# Patient Record
Sex: Female | Born: 1975 | Race: White | Hispanic: No | Marital: Married | Smoking: Never smoker
Health system: Southern US, Community
[De-identification: ages and names within clinical notes are randomized; demographics above are authoritative.]

## PROBLEM LIST (undated history)

## (undated) DIAGNOSIS — Z789 Other specified health status: Secondary | ICD-10-CM

## (undated) HISTORY — PX: WISDOM TOOTH EXTRACTION: SHX21

---

## 2008-05-06 ENCOUNTER — Inpatient Hospital Stay (HOSPITAL_COMMUNITY): Admission: AD | Admit: 2008-05-06 | Discharge: 2008-05-06 | Payer: Self-pay | Admitting: Obstetrics and Gynecology

## 2008-10-19 ENCOUNTER — Inpatient Hospital Stay (HOSPITAL_COMMUNITY): Admission: AD | Admit: 2008-10-19 | Discharge: 2008-10-22 | Payer: Self-pay | Admitting: Obstetrics and Gynecology

## 2010-01-27 ENCOUNTER — Emergency Department (HOSPITAL_COMMUNITY): Admission: EM | Admit: 2010-01-27 | Discharge: 2010-01-27 | Payer: Self-pay | Admitting: Emergency Medicine

## 2010-10-06 NOTE — L&D Delivery Note (Signed)
Delivery Note  Pt began to feel pressure and some urge to push with anterior lip of cervix. After several position changes, cervix reduced and pt had stronger urge to push. FHR remained stable.  At 5:08 PM a viable female was delivered via Vaginal, Spontaneous Delivery (Presentation: Right Occiput Anterior). Born en caul, loose nuchal reduced before shoulders delivered.  APGAR: 8, 9; weight 7 lb 4.2 oz (3295 g).   Placenta status: Intact, Spontaneous.  Cord: 3 vessels with the following complications: None.   Anesthesia: Local  Episiotomy: None Lacerations: 1st degree Suture Repair: 4-0 Est. Blood Loss (mL): 300  Mom to postpartum.  Baby to nursery-stable. Skin-skin Plans to BF   Katrina King M 10/06/2011, 5:51 PM

## 2011-01-20 LAB — CBC
HCT: 37.7 % (ref 36.0–46.0)
HCT: 32.8 % — ABNORMAL LOW (ref 36.0–46.0)
Hemoglobin: 11.2 g/dL — ABNORMAL LOW (ref 12.0–15.0)
MCHC: 32.7 g/dL (ref 30.0–36.0)
MCHC: 34.2 g/dL (ref 30.0–36.0)
MCV: 89.8 fL (ref 78.0–100.0)
MCV: 89.2 fL (ref 78.0–100.0)
Platelets: 158 10*3/uL (ref 150–400)
Platelets: 140 10*3/uL — ABNORMAL LOW (ref 150–400)
RDW: 14.8 % (ref 11.5–15.5)
WBC: 13.4 10*3/uL — ABNORMAL HIGH (ref 4.0–10.5)

## 2011-01-20 LAB — COMPREHENSIVE METABOLIC PANEL
ALT: 14 U/L (ref 0–35)
Alkaline Phosphatase: 146 U/L — ABNORMAL HIGH (ref 39–117)
Creatinine, Ser: 0.5 mg/dL (ref 0.4–1.2)
GFR calc non Af Amer: >60 mL/min (ref >60)
Total Bilirubin: 0.2 mg/dL — ABNORMAL LOW (ref 0.3–1.2)

## 2011-01-20 LAB — URIC ACID: Uric Acid, Serum: 4.4 mg/dL (ref 2.4–7.0)

## 2011-02-14 LAB — RPR
RPR: NONREACTIVE
RPR: NONREACTIVE

## 2011-02-14 LAB — GC/CHLAMYDIA PROBE AMP, GENITAL: Chlamydia: NEGATIVE

## 2011-02-14 LAB — ABO/RH

## 2011-02-14 LAB — HIV ANTIBODY (ROUTINE TESTING W REFLEX)
HIV: NONREACTIVE
HIV: NONREACTIVE

## 2011-02-14 LAB — ANTIBODY SCREEN: Antibody Screen: NEGATIVE

## 2011-07-04 LAB — URINALYSIS, ROUTINE W REFLEX MICROSCOPIC
Nitrite: NEGATIVE
Protein, ur: NEGATIVE

## 2011-07-04 LAB — URINE MICROSCOPIC-ADD ON

## 2011-10-06 ENCOUNTER — Inpatient Hospital Stay (HOSPITAL_COMMUNITY)
Admission: AD | Admit: 2011-10-06 | Discharge: 2011-10-07 | DRG: 373 | Disposition: A | Discharge status: Home / Self Care | Payer: BC Managed Care – PPO | Source: Ambulatory Visit | Attending: Obstetrics and Gynecology | Admitting: Obstetrics and Gynecology

## 2011-10-06 ENCOUNTER — Encounter (HOSPITAL_COMMUNITY): Payer: Self-pay

## 2011-10-06 DIAGNOSIS — Z3490 Encounter for supervision of normal pregnancy, unspecified, unspecified trimester: Secondary | ICD-10-CM

## 2011-10-06 DIAGNOSIS — O2200 Varicose veins of lower extremity in pregnancy, unspecified trimester: Secondary | ICD-10-CM | POA: Diagnosis present

## 2011-10-06 DIAGNOSIS — I8390 Asymptomatic varicose veins of unspecified lower extremity: Secondary | ICD-10-CM

## 2011-10-06 HISTORY — DX: Other specified health status: Z78.9

## 2011-10-06 LAB — CBC
HCT: 38.9 % (ref 36.0–46.0)
Hemoglobin: 13.1 g/dL (ref 12.0–15.0)
MCHC: 33.7 g/dL (ref 30.0–36.0)
WBC: 9.5 10*3/uL (ref 4.0–10.5)

## 2011-10-06 LAB — SYPHILIS: RPR W/REFLEX TO RPR TITER AND TREPONEMAL ANTIBODIES, TRADITIONAL SCREENING AND DIAGNOSIS ALGORITHM: RPR Ser Ql: NONREACTIVE

## 2011-10-06 MED ORDER — BENZOCAINE-MENTHOL 20-0.5 % EX AERO
INHALATION_SPRAY | CUTANEOUS | Status: AC
  Administered 2011-10-06: 1 via TOPICAL
  Filled 2011-10-06: qty 56

## 2011-10-06 MED ORDER — IBUPROFEN 600 MG PO TABS: 600.0000 mg | ORAL_TABLET | Freq: Four times a day (QID) | ORAL | Status: DC | PRN

## 2011-10-06 MED ORDER — OXYCODONE-ACETAMINOPHEN 5-325 MG PO TABS: 2.0000 | ORAL_TABLET | ORAL | Status: DC | PRN

## 2011-10-06 MED ORDER — PRENATAL MULTIVITAMIN CH
1.0000 | ORAL_TABLET | Freq: Every day | ORAL | Status: DC
  Administered 2011-10-07: 1 via ORAL
  Filled 2011-10-06: qty 1

## 2011-10-06 MED ORDER — DIPHENHYDRAMINE HCL 50 MG/ML IJ SOLN: 12.5000 mg | INTRAMUSCULAR | Status: DC | PRN

## 2011-10-06 MED ORDER — LIDOCAINE HCL (PF) 1 % IJ SOLN
30.0000 mL | INTRAMUSCULAR | Status: DC | PRN
  Filled 2011-10-06: qty 30

## 2011-10-06 MED ORDER — PHENYLEPHRINE 40 MCG/ML (10ML) SYRINGE FOR IV PUSH (FOR BLOOD PRESSURE SUPPORT): 80.0000 ug | PREFILLED_SYRINGE | INTRAVENOUS | Status: DC | PRN

## 2011-10-06 MED ORDER — LACTATED RINGERS IV SOLN
INTRAVENOUS | Status: DC
  Administered 2011-10-06: 14:00:00 via INTRAVENOUS

## 2011-10-06 MED ORDER — ACETAMINOPHEN 325 MG PO TABS: 650.0000 mg | ORAL_TABLET | ORAL | Status: DC | PRN

## 2011-10-06 MED ORDER — DIBUCAINE 1 % RE OINT: 1.0000 "application " | TOPICAL_OINTMENT | RECTAL | Status: DC | PRN

## 2011-10-06 MED ORDER — IBUPROFEN 600 MG PO TABS
600.0000 mg | ORAL_TABLET | Freq: Four times a day (QID) | ORAL | Status: DC
  Administered 2011-10-06 – 2011-10-07 (×4): 600 mg via ORAL
  Filled 2011-10-06 (×4): qty 1

## 2011-10-06 MED ORDER — WITCH HAZEL-GLYCERIN EX PADS
1.0000 "application " | MEDICATED_PAD | CUTANEOUS | Status: DC | PRN
  Administered 2011-10-06: 1 via TOPICAL

## 2011-10-06 MED ORDER — LANOLIN HYDROUS EX OINT: TOPICAL_OINTMENT | CUTANEOUS | Status: DC | PRN

## 2011-10-06 MED ORDER — OXYCODONE-ACETAMINOPHEN 5-325 MG PO TABS: 1.0000 | ORAL_TABLET | ORAL | Status: DC | PRN

## 2011-10-06 MED ORDER — TETANUS-DIPHTH-ACELL PERTUSSIS 5-2.5-18.5 LF-MCG/0.5 IM SUSP: 0.5000 mL | Freq: Once | INTRAMUSCULAR | Status: DC

## 2011-10-06 MED ORDER — MEASLES, MUMPS & RUBELLA VAC ~~LOC~~ INJ: 0.5000 mL | INJECTION | Freq: Once | SUBCUTANEOUS | Status: DC

## 2011-10-06 MED ORDER — EPHEDRINE 5 MG/ML INJ: 10.0000 mg | INTRAVENOUS | Status: DC | PRN

## 2011-10-06 MED ORDER — LACTATED RINGERS IV SOLN: 500.0000 mL | INTRAVENOUS | Status: DC | PRN

## 2011-10-06 MED ORDER — FENTANYL 2.5 MCG/ML BUPIVACAINE 1/10 % EPIDURAL INFUSION (WH - ANES): 14.0000 mL/h | INTRAMUSCULAR | Status: DC

## 2011-10-06 MED ORDER — SODIUM CHLORIDE 0.9 % IJ SOLN: 3.0000 mL | Freq: Two times a day (BID) | INTRAMUSCULAR | Status: DC

## 2011-10-06 MED ORDER — DIPHENHYDRAMINE HCL 25 MG PO CAPS: 25.0000 mg | ORAL_CAPSULE | Freq: Four times a day (QID) | ORAL | Status: DC | PRN

## 2011-10-06 MED ORDER — SIMETHICONE 80 MG PO CHEW: 80.0000 mg | CHEWABLE_TABLET | ORAL | Status: DC | PRN

## 2011-10-06 MED ORDER — ONDANSETRON HCL 4 MG/2ML IJ SOLN: 4.0000 mg | Freq: Four times a day (QID) | INTRAMUSCULAR | Status: DC | PRN

## 2011-10-06 MED ORDER — OXYTOCIN BOLUS FROM INFUSION
500.0000 mL | Freq: Once | INTRAVENOUS | Status: DC
  Filled 2011-10-06: qty 1000
  Filled 2011-10-06: qty 500

## 2011-10-06 MED ORDER — ONDANSETRON HCL 4 MG PO TABS: 4.0000 mg | ORAL_TABLET | ORAL | Status: DC | PRN

## 2011-10-06 MED ORDER — ZOLPIDEM TARTRATE 5 MG PO TABS: 5.0000 mg | ORAL_TABLET | Freq: Every evening | ORAL | Status: DC | PRN

## 2011-10-06 MED ORDER — LACTATED RINGERS IV SOLN: 500.0000 mL | Freq: Once | INTRAVENOUS | Status: DC

## 2011-10-06 MED ORDER — OXYTOCIN 10 UNIT/ML IJ SOLN: 10.0000 [IU] | Freq: Once | INTRAMUSCULAR | Status: DC

## 2011-10-06 MED ORDER — SENNOSIDES-DOCUSATE SODIUM 8.6-50 MG PO TABS
2.0000 | ORAL_TABLET | Freq: Every day | ORAL | Status: DC
  Administered 2011-10-06: 2 via ORAL

## 2011-10-06 MED ORDER — OXYTOCIN 20 UNITS IN LACTATED RINGERS INFUSION - SIMPLE
125.0000 mL/h | Freq: Once | INTRAVENOUS | Status: DC
Start: 2011-10-06 — End: 2011-10-06

## 2011-10-06 MED ORDER — ONDANSETRON HCL 4 MG/2ML IJ SOLN: 4.0000 mg | INTRAMUSCULAR | Status: DC | PRN

## 2011-10-06 MED ORDER — CITRIC ACID-SODIUM CITRATE 334-500 MG/5ML PO SOLN: 30.0000 mL | ORAL | Status: DC | PRN

## 2011-10-06 MED ORDER — BUTORPHANOL TARTRATE 2 MG/ML IJ SOLN: 2.0000 mg | INTRAMUSCULAR | Status: DC | PRN

## 2011-10-06 MED ORDER — BENZOCAINE-MENTHOL 20-0.5 % EX AERO
1.0000 "application " | INHALATION_SPRAY | CUTANEOUS | Status: DC | PRN
  Administered 2011-10-06: 1 via TOPICAL

## 2011-10-06 NOTE — Progress Notes (Signed)
Pt states since 0430 this am has had progressively stronger ctx's between 3-5 minutes now. +Fm, denies bleeding or gush of fluid. Was 0.5cm in office last week.

## 2011-10-06 NOTE — H&P (Signed)
Katrina King is a 35 y.o. female presenting for ctx that woke her up about 0430 and have been getting closer and stronger, denies VB, LOF +FM. Denies HA/N/V/epigastric pain.  Pregnancy significant for:  1. Latex allergy 2. Hx abnl pap 3. Varicosities  HPI: pt began [facility] at 10wks. She has struggled with varicose veins, as with previous pregnancy. She had a normal anatomy scan. At 27wks she had a normal 1hr gtt. She then had an URI and was given z-pack. At 36wks, GBS, GC/CT were done and negative.    Maternal Medical History:  Reason for admission: Reason for admission: contractions.  Contractions: Onset was 6-12 hours ago.   Frequency: regular.   Duration is approximately 60 seconds.   Perceived severity is moderate.    Fetal activity: Perceived fetal activity is normal.      OB History    Grav Para Term Preterm Abortions TAB SAB Ect Mult Living   2 1 1       1      Past Medical History  Diagnosis Date  . No pertinent past medical history    Past Surgical History  Procedure Date  . Wisdom tooth extraction    Family History: family history is not on file. CHTN - father, sister, PGF, mother COPD - father, PU Skin CA - mother Prostate - MGF ETOH abuse - father  Social History:  reports that she has never smoked. She does not have any smokeless tobacco history on file. She reports that she does not drink alcohol or use illicit drugs.  Pt is a 35yo MWF, 12yrs education, works FT as a professor. Katrina King husband also works Teacher, English as a foreign language, live with their 3yo daughter.   Review of Systems  All other systems reviewed and are negative.    Dilation: 4 Effacement (%): 100 Station: 0 Exam by:: S. Johnathan Heskett CNM Blood pressure 138/90, pulse 87, temperature 98.2 F (36.8 C), temperature source Oral, resp. rate 16, height 5\' 2"  (1.575 m), weight 78.132 kg (172 lb 4 oz). Maternal Exam:  Uterine Assessment: Contraction strength is moderate.  Contraction duration is 60 seconds.  Contraction frequency is regular.   Abdomen: Patient reports no abdominal tenderness. Fundal height is aga.   Estimated fetal weight is 7-11.   Fetal presentation: vertex  Introitus: Normal vulva. Normal vagina.  Pelvis: adequate for delivery.   Cervix: Cervix evaluated by digital exam.     Fetal Exam Fetal Monitor Review: Mode: ultrasound.   Baseline rate: 140.  Variability: moderate (6-25 bpm).   Pattern: accelerations present and no decelerations.    Fetal State Assessment: Category I - tracings are normal.     Physical Exam  Nursing note and vitals reviewed. Constitutional: She is oriented to person, place, and time. She appears well-developed and well-nourished.  Cardiovascular: Normal rate and normal heart sounds.   Respiratory: Effort normal.  GI: Soft.  Genitourinary: Vagina normal.  Musculoskeletal: Normal range of motion. She exhibits no edema.  Neurological: She is alert and oriented to person, place, and time. She has normal reflexes.  Skin: Skin is warm and dry.  Psychiatric: She has a normal mood and affect. Her behavior is normal. Judgment and thought content normal.    Prenatal labs: ABO, Rh:  A pos Antibody:  neg Rubella:  IMM RPR:   NR HBsAg:   neg HIV:   neg GBS:   neg Declined genetic screens 1hr gtt - WNL GC/CT/pap - neg   Assessment/Plan: IUP at [redacted]w[redacted]d Early labor  GBS neg FHR reassuring  Admit to birthing suites - Dr. Normand Sloop attending, CNM care Routine CNM orders, SL for now Epidural PRN Intermittent EFM Consider AROM PRN  Katrina King M 10/06/2011, 12:54 PM

## 2011-10-07 DIAGNOSIS — I8390 Asymptomatic varicose veins of unspecified lower extremity: Secondary | ICD-10-CM

## 2011-10-07 LAB — CBC
Platelets: 144 10*3/uL — ABNORMAL LOW (ref 150–400)
RDW: 14.5 % (ref 11.5–15.5)
WBC: 11 10*3/uL — ABNORMAL HIGH (ref 4.0–10.5)

## 2011-10-07 MED ORDER — IBUPROFEN 600 MG PO TABS: 600.0000 mg | ORAL_TABLET | Freq: Four times a day (QID) | ORAL | Status: AC | PRN

## 2011-10-07 NOTE — Discharge Summary (Signed)
Physician Discharge Summary  Patient ID: Katrina King MRN: [id number] DOB/AGE: 35/13/1977 35 y.o.  Admit date: 10/06/2011 Discharge date: 10/07/2011  Admission Diagnoses:  Discharge Diagnoses:  Active Problems:  SVD (spontaneous vaginal delivery)  Varicosities  Discharged Condition: stable  Hospital Course: admitted in labor at term, SVD, normal involution, lactating  Discharge Exam: Blood pressure 129/83, pulse 94, temperature 98.2 F (36.8 C), temperature source Oral, resp. rate 22, height 5\' 2"  (1.575 m), weight 172 lb 4 oz (78.132 kg), unknown if currently breastfeeding. General appearance: alert, cooperative and no distress  Disposition:   Discharge Orders    Future Orders Please Complete By Expires   Discharge instructions      Comments:   Home, f/o office 6 weeks   Strep B DNA probe      Comments:   This external order was created through the Results Console.   HIV antibody      Comments:   This external order was created through the Results Console.   Rubella antibody, IgM      Comments:   This external order was created through the Results Console.   Hepatitis B surface antigen      Comments:   This external order was created through the Results Console.   RPR      Comments:   This external order was created through the Results Console.   HIV antibody      Comments:   This external order was created through the Results Console.   RPR      Comments:   This external order was created through the Results Console.   GC/chlamydia probe amp, genital      Comments:   This external order was created through the Results Console.   GC/chlamydia probe amp, genital      Comments:   This external order was created through the Results Console.   Antibody screen      Comments:   This external order was created through the Results Console.   ABO/Rh      Comments:   This external order was created through the Results Console.     Medication List  As of  10/07/2011 10:51 AM   START taking these medications         ibuprofen 600 MG tablet   Commonly known as: ADVIL,MOTRIN   Take 1 tablet (600 mg total) by mouth every 6 (six) hours as needed for pain.         CONTINUE taking these medications         calcium carbonate 500 MG chewable tablet   Commonly known as: TUMS - dosed in mg elemental calcium      prenatal multivitamin Tabs          Where to get your medications    These are the prescriptions that you need to pick up.   You may get these medications from any pharmacy.         ibuprofen 600 MG tablet          States no pain, little bleeding, breastfeeding is going well, undecided regarding birth control O FF sm rubra flow, -Homan's sign bilaterally, no edema to lower legs,  A normal involution     Pp day 1 lactating Follow-up Information    Follow up with [facility] in 6 weeks.         SignedLavera Guise 10/07/2011, 10:51 AM

## 2014-08-07 ENCOUNTER — Encounter (HOSPITAL_COMMUNITY): Payer: Self-pay

## 2014-12-08 ENCOUNTER — Other Ambulatory Visit: Payer: Self-pay | Admitting: Family Medicine

## 2014-12-08 DIAGNOSIS — R059 Cough, unspecified: Secondary | ICD-10-CM

## 2014-12-08 DIAGNOSIS — R05 Cough: Secondary | ICD-10-CM

## 2014-12-12 ENCOUNTER — Ambulatory Visit
Admission: RE | Admit: 2014-12-12 | Discharge: 2014-12-12 | Disposition: A | Discharge status: Home / Self Care | Payer: BC Managed Care – PPO | Source: Ambulatory Visit | Attending: Family Medicine | Admitting: Family Medicine

## 2014-12-12 DIAGNOSIS — R05 Cough: Secondary | ICD-10-CM

## 2014-12-12 DIAGNOSIS — R059 Cough, unspecified: Secondary | ICD-10-CM

## 2015-01-03 ENCOUNTER — Other Ambulatory Visit: Payer: Self-pay | Admitting: Family Medicine

## 2015-01-03 DIAGNOSIS — R0600 Dyspnea, unspecified: Secondary | ICD-10-CM

## 2015-01-04 ENCOUNTER — Ambulatory Visit (INDEPENDENT_AMBULATORY_CARE_PROVIDER_SITE_OTHER): Payer: BC Managed Care – PPO | Admitting: Internal Medicine

## 2015-01-04 DIAGNOSIS — R0600 Dyspnea, unspecified: Secondary | ICD-10-CM | POA: Diagnosis not present

## 2015-01-04 LAB — PULMONARY FUNCTION TEST
DL/VA % pred: 86 %
DL/VA: 4.03 ml/min/mmHg/L
DLCO UNC: 20.6 ml/min/mmHg
DLCO unc % pred: 89 %
FEF 25-75 Post: 4.11 L/sec
FEF 25-75 Pre: 4.14 L/sec
FEF2575-%Change-Post: 0 %
FEF2575-%PRED-PRE: 130 %
FEF2575-%Pred-Post: 129 %
FEV1-%CHANGE-POST: 1 %
FEV1-%Pred-Post: 120 %
FEV1-%Pred-Pre: 118 %
FEV1-Post: 3.58 L
FEV1-Pre: 3.53 L
FEV1FVC-%CHANGE-POST: 1 %
FEV1FVC-%Pred-Pre: 104 %
FEV6-%Change-Post: 0 %
FEV6-%PRED-POST: 114 %
FEV6-%Pred-Pre: 114 %
FEV6-Post: 4.05 L
FEV6-Pre: 4.06 L
FEV6FVC-%PRED-PRE: 101 %
FEV6FVC-%Pred-Post: 101 %
FVC-%CHANGE-POST: 0 %
FVC-%PRED-POST: 112 %
FVC-%PRED-PRE: 112 %
FVC-POST: 4.05 L
FVC-Pre: 4.06 L
POST FEV6/FVC RATIO: 100 %
Post FEV1/FVC ratio: 88 %
Pre FEV1/FVC ratio: 87 %
Pre FEV6/FVC Ratio: 100 %
RV % PRED: 130 %
RV: 1.94 L
TLC % PRED: 114 %
TLC: 5.62 L

## 2015-01-04 NOTE — Progress Notes (Signed)
PFT done today. 

## 2015-01-19 ENCOUNTER — Institutional Professional Consult (permissible substitution): Payer: BC Managed Care – PPO | Admitting: Internal Medicine

## 2015-01-23 ENCOUNTER — Institutional Professional Consult (permissible substitution): Payer: BC Managed Care – PPO | Admitting: Internal Medicine

## 2015-01-25 ENCOUNTER — Ambulatory Visit (INDEPENDENT_AMBULATORY_CARE_PROVIDER_SITE_OTHER): Payer: BC Managed Care – PPO | Admitting: Internal Medicine

## 2015-01-25 ENCOUNTER — Encounter: Payer: Self-pay | Admitting: Internal Medicine

## 2015-01-25 VITALS — BP 110/80 | HR 84 | Ht 62.0 in | Wt 148.0 lb

## 2015-01-25 DIAGNOSIS — R938 Abnormal findings on diagnostic imaging of other specified body structures: Secondary | ICD-10-CM

## 2015-01-25 DIAGNOSIS — R05 Cough: Secondary | ICD-10-CM

## 2015-01-25 DIAGNOSIS — R059 Cough, unspecified: Secondary | ICD-10-CM

## 2015-01-25 DIAGNOSIS — R9389 Abnormal findings on diagnostic imaging of other specified body structures: Secondary | ICD-10-CM | POA: Insufficient documentation

## 2015-01-25 NOTE — Assessment & Plan Note (Signed)
See CT chest 12/12/14  the posterior pleural nodularity is bilateral and extremely subtle and very unlikely to be of any significance at all

## 2015-01-25 NOTE — Progress Notes (Addendum)
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   01/25/2015 1st [facility] office visit/ [place]   Chief Complaint  Patient presents with  . Pulmonary Consult    Referred by Dr Hyman HopesWebb due to non-productive cough and wheezing since PNA in october 2015.   episodic cough since abrupt onset Oct 2015 no pattern but recurs x 30 min - 2 h/ resolved spontaneously x  a few days to a week   > produces sev tbsp thick ropy mucus, clear. Had started using lots of "peppermint oils" since the cough started but denies overt hb though was bothered by it at her last IUP 6 y prior to OV  But didn't make her cough and this makes her feel tight wheezy s HB  No obvious patterns in day to day or daytime variabilty or assoc sob ( except during the severe cough) or cp or chest tightness, subjective wheeze or overt sinus   symptoms. No unusual exp hx or h/o childhood pna/ asthma or knowledge of premature birth.  Sleeping ok without nocturnal  or early am exacerbation  of respiratory  c/o's or need for noct saba. Also denies any obvious fluctuation of symptoms with weather or environmental changes or other aggravating or alleviating factors except as outlined above   Current Medications, Allergies, Complete Past Medical History, Past Surgical History, Family History, and Social History were reviewed in [facility] electronic medical record.      Review of Systems  Constitutional: Negative for fever, chills and unexpected weight change.  HENT: Positive for congestion and sneezing. Negative for  dental problem, ear pain, nosebleeds, postnasal drip, rhinorrhea, sinus pressure, sore throat, trouble swallowing and voice change.   Eyes: Negative for visual disturbance.  Respiratory: Positive for cough, choking and shortness of breath.   Cardiovascular: Negative for chest pain and leg swelling.  Gastrointestinal: Negative for vomiting, abdominal pain and diarrhea.  Genitourinary: Negative for difficulty urinating.  Musculoskeletal: Negative for arthralgias.  Skin: Negative for rash.  Neurological: Negative for tremors, syncope and headaches.  Hematological: Does not bruise/bleed easily.       Objective:   Physical Exam  amb pleasant and healthy appearing wf nad  Wt Readings from Last 3 Encounters:  01/25/15 148 lb (67.132 kg)  10/06/11 172 lb 4 oz (78.132 kg)    Vital signs reviewed  HEENT: nl dentition, turbinates, and orophanx. Nl external ear canals without cough reflex   NECK :  without JVD/Nodes/TM/ nl carotid upstrokes bilaterally   LUNGS: no acc muscle use, clear to A and P bilaterally without cough on insp or exp maneuvers   CV:  RRR  no s3 or murmur or increase in P2, no edema   ABD:  soft and nontender with nl excursion in the supine position. No bruits or organomegaly, bowel sounds nl  MS:  warm without deformities, calf tenderness, cyanosis or clubbing  SKIN: warm and dry without lesions    NEURO:  alert, approp, no deficits  I personally reviewed images and agree with radiology impression as follows:  CT chest 12/12/14 On lung window images, no lung infiltrate is seen. There is no evidence of pleural effusion. No suspicious lung nodule is noted. Very minimal nodularity of the posterior pleura is present without calcification, of questionable significance. The central airway is patent.  On soft tissue window images, no mediastinal or hilar adenopathy is seen on this unenhanced study. The portion of the upper abdomen that is visualized is  unremarkable. The thoracic vertebrae are in normal alignment. There is a calcified disc at the T9-10 level. My review : the posterior pleural nodularity is bilateral and extremely subtle and very unlikely to be of any significance at all        Assessment & Plan:

## 2015-01-25 NOTE — Assessment & Plan Note (Signed)
The most common causes of chronic cough in immunocompetent adults include the following: upper airway cough syndrome (UACS), previously referred to as postnasal drip syndrome (PNDS), which is caused by variety of rhinosinus conditions; (2) asthma; (3) GERD; (4) chronic bronchitis from cigarette smoking or other inhaled environmental irritants; (5) nonasthmatic eosinophilic bronchitis; and (6) bronchiectasis.   These conditions, singly or in combination, have accounted for up to 94% of the causes of chronic cough in prospective studies.   Other conditions have constituted no >6% of the causes in prospective studies These have included bronchogenic carcinoma, chronic interstitial pneumonia, sarcoidosis, left ventricular failure, ACEI-induced cough, and aspiration from a condition associated with pharyngeal dysfunction.    Chronic cough is often simultaneously caused by more than one condition. A single cause has been found from 38 to 82% of the time, multiple causes from 18 to 62%. Multiply caused cough has been the result of three diseases up to 42% of the time.       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   These are the same pts (now being labeled as having "irritable larynx syndrome" by some cough centers) who not infrequently have a history of having failed to tolerate ace inhibitors,  dry powder inhalers or biphosphonates or report having atypical reflux symptoms that don't respond to standard doses of PPI , and are easily confused as  having aecopd or asthma flares by even experienced allergists/ pulmonologists.   rec ask her to get rid of all mint products/ add acid suppression if still persists and sinus ct in that order  Discussed with pt The standardized cough guidelines published in Chest by [facility] in 2006 are still the best available and consist of a multiple step process (up to 12!) , not a single office visit,  and are intended  to address this problem logically,  with an alogrithm dependent on response to empiric treatment at  each progressive step  to determine a specific diagnosis with  minimal addtional testing needed. Therefore if adherence is an issue or can't be accurately verified,  it's very unlikely the standard evaluation and treatment will be successful here.    Furthermore, response to therapy (other than acute cough suppression, which should only be used short term with avoidance of narcotic containing cough syrups if possible), can be a gradual process for which the patient may perceive immediate benefit.  Unlike going to an eye doctor where the best perscription is almost always the first one and is immediately effective, this is almost never the case in the management of chronic cough syndromes. Therefore the patient needs to commit up front to consistently adhere to recommendations  for up to 6 weeks of therapy directed at the likely underlying problem(s) before the response can be reasonably evaluated.

## 2015-01-25 NOTE — Patient Instructions (Signed)
GERD (REFLUX)  is an extremely common cause of respiratory symptoms just like yours , many times with no obvious heartburn at all.    It can be treated with medication, but also with lifestyle changes including avoidance of late meals, excessive alcohol,  and avoid fatty foods, chocolate, peppermint, colas, red wine, and acidic juices such as orange juice.  NO MINT OR MENTHOL PRODUCTS SO NO COUGH DROPS  USE SUGARLESS CANDY INSTEAD (Jolley ranchers or Stover's or Life Savers) or even ice chips will also do - the key is to swallow to prevent all throat clearing. NO OIL BASED VITAMINS - use powdered substitutes.   If not improving >  Add Try prilosec 20mg   Take 30-60 min before first meal of the day and Pepcid 20 mg one bedtime until cough is completely gone for at least 2 weeks without the need for cough suppression  I think of reflux for chronic cough like I do oxygen for fire (doesn't cause the fire but once you get the oxygen suppressed it usually goes away regardless of the exact cause).   If not better call [facility] [phone] for sinus CT.

## 2016-10-27 IMAGING — CT CT CHEST W/O CM
1 of 4 series · 15 of 30 positions shown, 19 images · non-contrast
Comparison: Chest x-ray of 09/17/2014

CLINICAL DATA: Persistent cough, chest tightness

EXAM:
CT CHEST WITHOUT CONTRAST
TECHNIQUE: Multidetector CT imaging of the chest was performed following the
standard protocol without IV contrast..

[Series 2: thin/ super d · axial · 0.70mm/px · z∈[-242,+11]mm · 15 of 291 slices shown, 19 images]
[im 19/291  mediastinal]
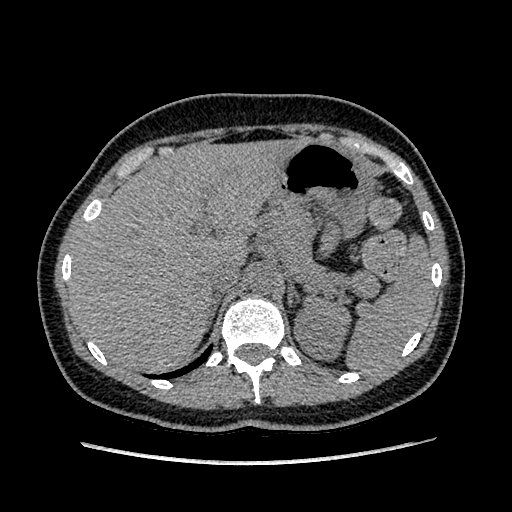
[im 19/291  lung]
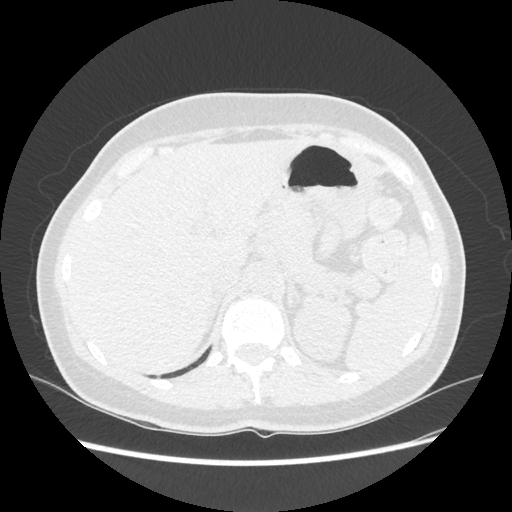
[im 37/291  lung]
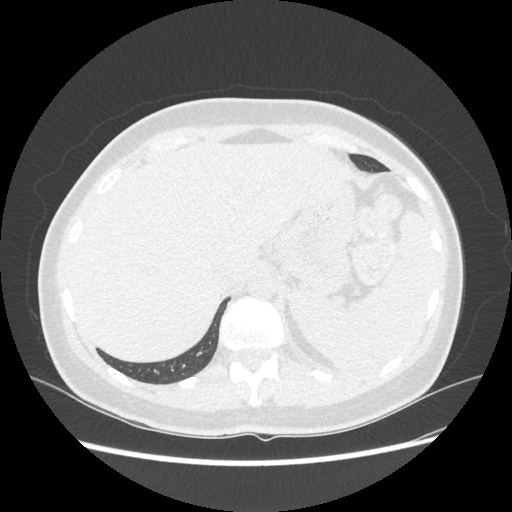
[im 55/291  lung]
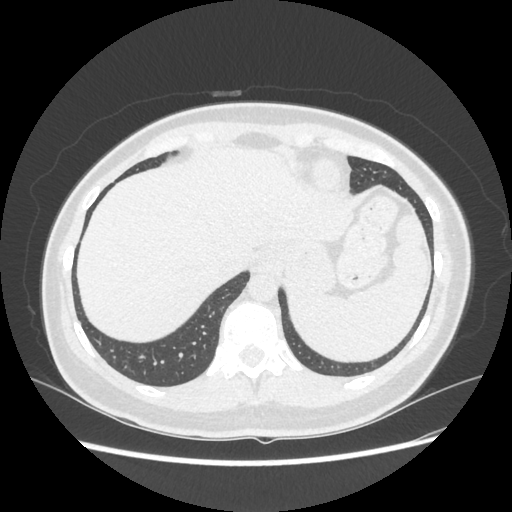
[im 73/291  lung]
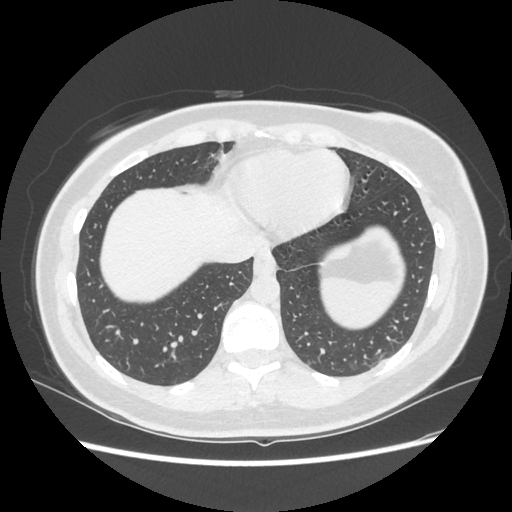
[im 91/291  mediastinal]
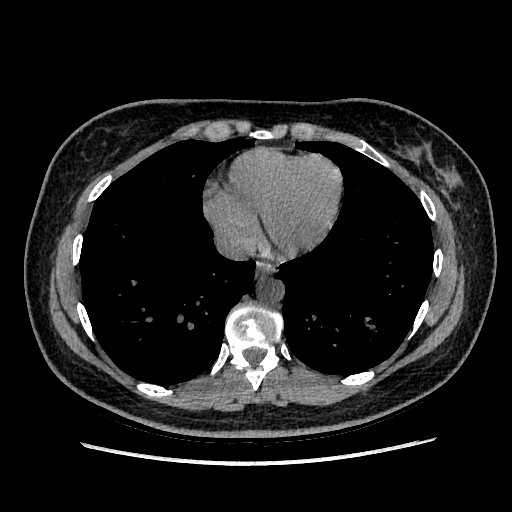
[im 91/291  lung]
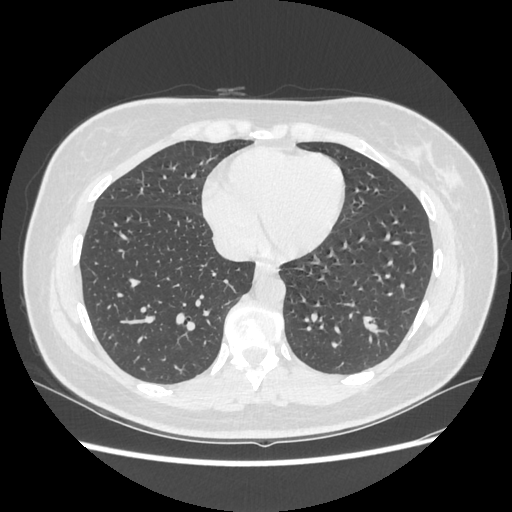
[im 109/291  lung]
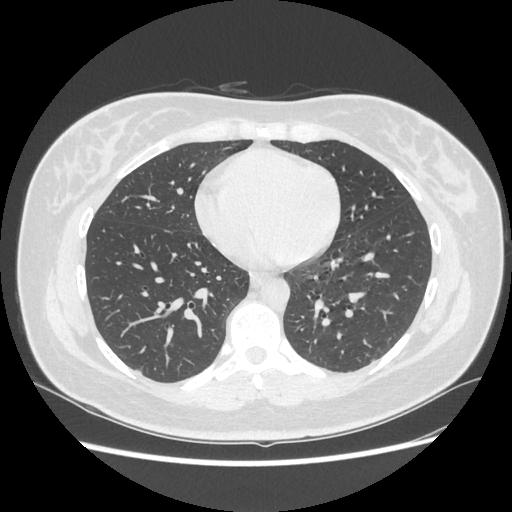
[im 127/291  lung]
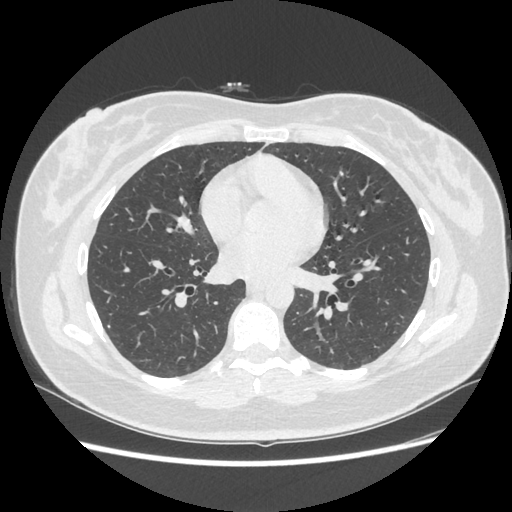
[im 146/291  lung]
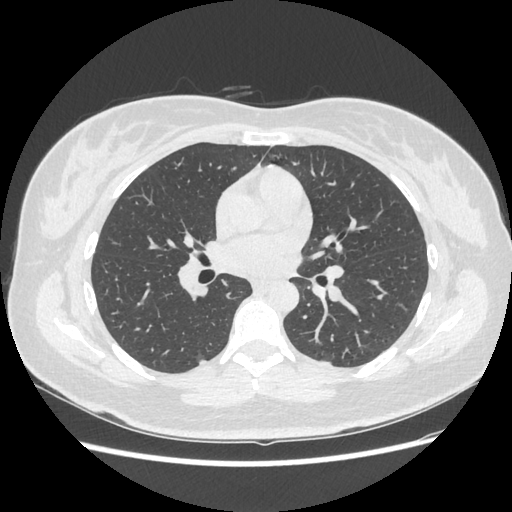
[im 164/291  mediastinal]
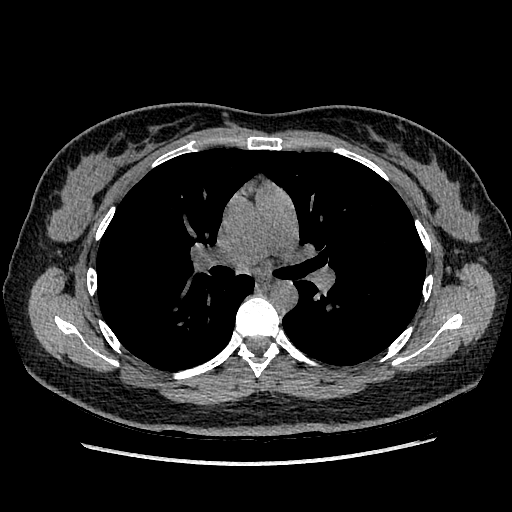
[im 164/291  lung]
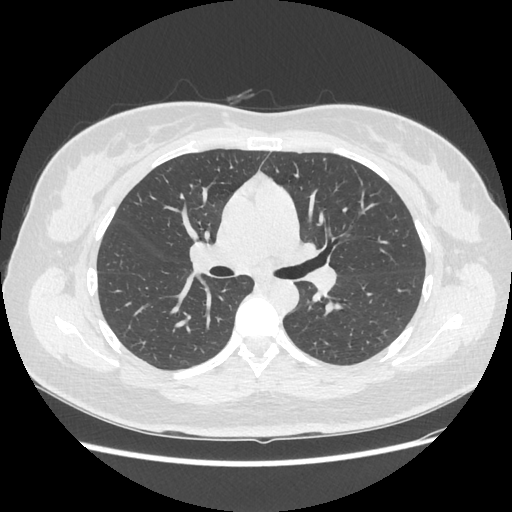
[im 182/291  lung]
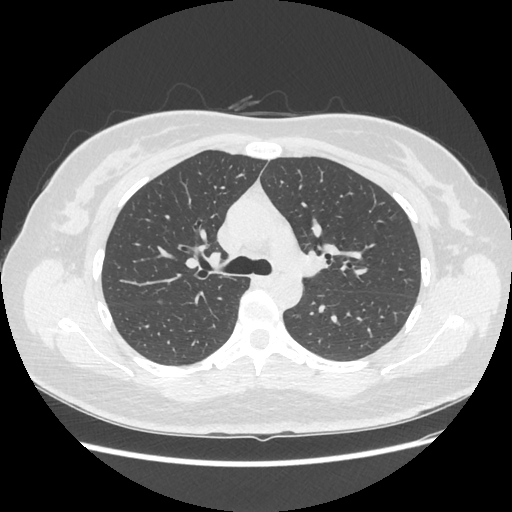
[im 200/291  lung]
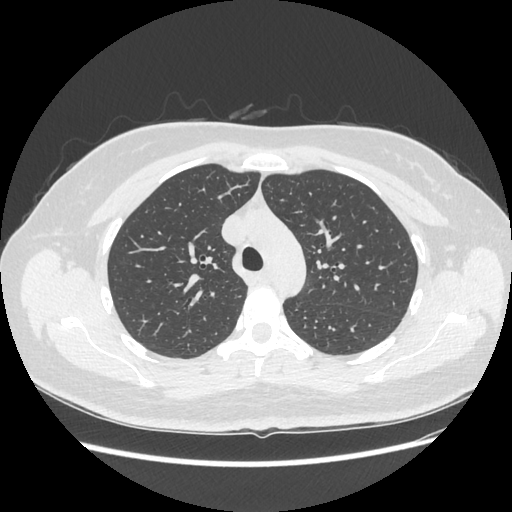
[im 218/291  lung]
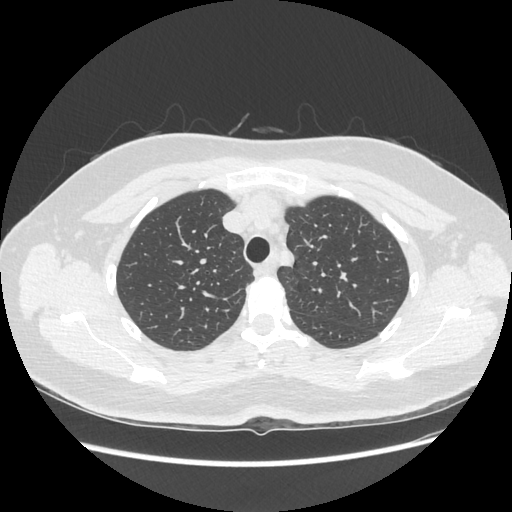
[im 236/291  mediastinal]
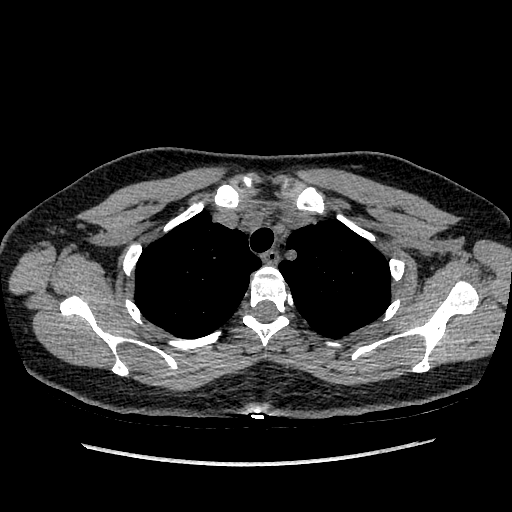
[im 236/291  lung]
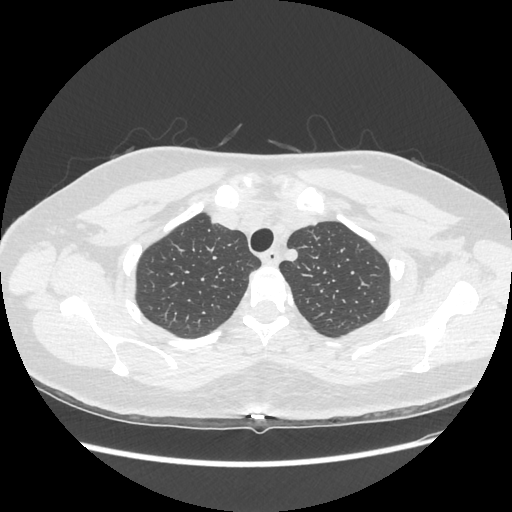
[im 254/291  lung]
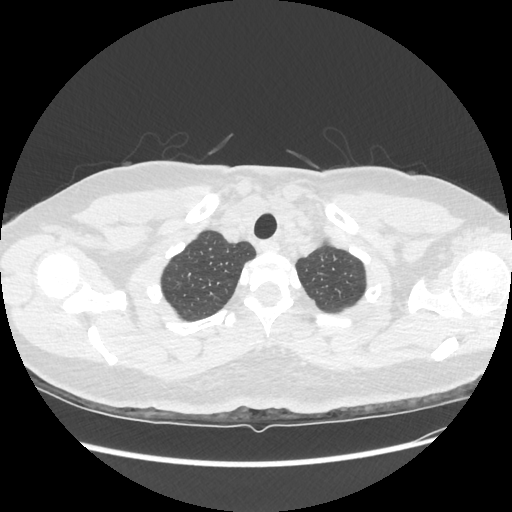
[im 272/291  lung]
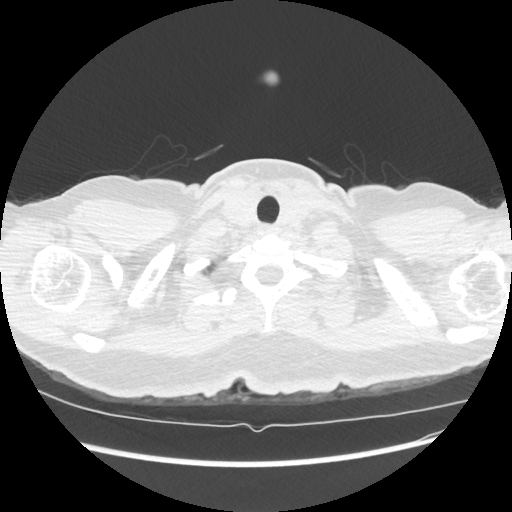

[15 of 30 positions shown; findings below may reference images not displayed]

FINDINGS: On lung window images, no lung infiltrate is seen. There is no
evidence of pleural effusion. No suspicious lung nodule is noted.
Very minimal nodularity of the posterior pleura is present without
calcification, of questionable significance. The central airway is
patent.

On soft tissue window images, no mediastinal or hilar adenopathy is
seen on this unenhanced study. The portion of the upper abdomen that
is visualized is unremarkable. The thoracic vertebrae are in normal
alignment. There is a calcified disc at the T9-10 level.
IMPRESSION: Negative CT of the chest.
